# Patient Record
Sex: Female | Born: 2000 | Race: Black or African American | Hispanic: No | Marital: Single | Smoking: Never smoker
Health system: Southern US, Community
[De-identification: ages and names within clinical notes are randomized; demographics above are authoritative.]

---
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
## 2019-02-25 ENCOUNTER — Emergency Department (HOSPITAL_COMMUNITY): Payer: Medicaid Other

## 2019-02-25 ENCOUNTER — Emergency Department (HOSPITAL_COMMUNITY)
Admission: EM | Admit: 2019-02-25 | Discharge: 2019-02-25 | Disposition: A | Discharge status: Home / Self Care | Payer: Medicaid Other | Attending: Emergency Medicine | Admitting: Emergency Medicine

## 2019-02-25 DIAGNOSIS — R0789 Other chest pain: Secondary | ICD-10-CM | POA: Insufficient documentation

## 2019-02-25 DIAGNOSIS — R079 Chest pain, unspecified: Secondary | ICD-10-CM | POA: Diagnosis present

## 2019-02-25 DIAGNOSIS — R071 Chest pain on breathing: Secondary | ICD-10-CM | POA: Insufficient documentation

## 2019-02-25 LAB — I-STAT BETA HCG BLOOD, ED (MC, WL, AP ONLY): I-stat hCG, quantitative: <5 m[IU]/mL (ref <5)

## 2019-02-25 MED ORDER — KETOROLAC TROMETHAMINE 30 MG/ML IJ SOLN
30.0000 mg | Freq: Once | INTRAMUSCULAR | Status: AC
  Administered 2019-02-25: 14:00:00 30 mg via INTRAMUSCULAR
  Filled 2019-02-25: qty 1

## 2019-02-25 MED ORDER — NAPROXEN 500 MG PO TABS: 500.0000 mg | ORAL_TABLET | Freq: Two times a day (BID) | ORAL | 0 refills | Status: AC

## 2019-02-25 NOTE — Discharge Instructions (Signed)
Return to the ED if you start to experience worsening symptoms, increased chest pain or shortness of breath, leg swelling, vomiting or coughing up blood.

## 2019-02-25 NOTE — ED Triage Notes (Signed)
Patient to ER for 3 week history of central chest discomfort, reproducible on palpation and worsened with movement and deep breathing. Denies recent illness. Reports history of same with relief with ibuprofen. She is in NAD. No medical history.

## 2019-02-25 NOTE — ED Provider Notes (Signed)
[facility] EMERGENCY DEPARTMENT Provider Note   CSN: [id number] Arrival date & time: 02/25/19  1013     History   Chief Complaint Chief Complaint  Patient presents with  . Chest Pain    HPI Kelly Smith is a 18 y.o. female with a past medical history of obesity presenting to the ED with a chief complaint of chest pain.  For the past 3 to 4 weeks been having central chest pain that has been worse with palpation and movement.  She does endorse a dry persistent cough which worsens the chest pain.  She has had some improvement with NSAIDs.  Had an episode today which prompted her visit to the ED where the chest pain was "a lot more intense."  She denies any known injury or trigger for this chest pain.  Denies shortness of breath, hemoptysis, leg swelling, history of DVT or PE, OCP use, recent immobilization or surgery, history of MI, nausea, abdominal pain.     HPI  No past medical history on file.  Patient Active Problem List   Diagnosis Date Noted  . Abnormal bowel movement 09/19/2016  . Obesity with body mass index (BMI) in 95th to 98th percentile for age in pediatric patient 09/19/2016  . Failed vision screen 02/11/2015  . History of panic attacks 02/04/2015  . BMI (body mass index), pediatric, 5% to less than 85% for age 70/27/2016    No past surgical history on file.   OB History   No obstetric history on file.      Home Medications    Prior to Admission medications   Medication Sig Start Date End Date Taking? Authorizing Provider  ibuprofen (ADVIL,MOTRIN) 800 MG tablet Take 1 tablet (800 mg total) by mouth every 8 (eight) hours as needed. 08/09/17   Viviano Simas, NP  naproxen (NAPROSYN) 500 MG tablet Take 1 tablet (500 mg total) by mouth 2 (two) times daily. 02/25/19   Raymar Joiner, PA-C  trimethoprim-polymyxin b (POLYTRIM) ophthalmic solution Place 1 drop into both eyes every 4 (four) hours. Patient not taking: Reported on 02/04/2015  10/20/14   Niel Hummer, MD    Family History No family history on file.  Social History Social History   Tobacco Use  . Smoking status: Never Smoker  . Smokeless tobacco: Never Used  Substance Use Topics  . Alcohol use: Not on file  . Drug use: Not on file     Allergies   Other   Review of Systems Review of Systems  Constitutional: Negative for appetite change, chills and fever.  HENT: Negative for ear pain, rhinorrhea, sneezing and sore throat.   Eyes: Negative for photophobia and visual disturbance.  Respiratory: Negative for cough, chest tightness, shortness of breath and wheezing.   Cardiovascular: Positive for chest pain. Negative for palpitations.  Gastrointestinal: Negative for abdominal pain, blood in stool, constipation, diarrhea, nausea and vomiting.  Genitourinary: Negative for dysuria, hematuria and urgency.  Musculoskeletal: Negative for myalgias.  Skin: Negative for rash.  Neurological: Negative for dizziness, weakness and light-headedness.     Physical Exam Updated Vital Signs BP 115/64 (BP Location: Right Arm)   Pulse 64   Temp 98.6 F (37 C) (Oral)   Resp 16   LMP 01/27/2019 (Approximate)   SpO2 100%   Physical Exam Vitals signs and nursing note reviewed.  Constitutional:      General: She is not in acute distress.    Appearance: She is well-developed.     Comments: Speaking in  complete sentences without difficulty.  HENT:     Head: Normocephalic and atraumatic.     Nose: Nose normal.  Eyes:     General: No scleral icterus.       Left eye: No discharge.     Conjunctiva/sclera: Conjunctivae normal.  Neck:     Musculoskeletal: Normal range of motion and neck supple.  Cardiovascular:     Rate and Rhythm: Normal rate and regular rhythm.     Heart sounds: Normal heart sounds. No murmur. No friction rub. No gallop.   Pulmonary:     Effort: Pulmonary effort is normal. No respiratory distress.     Breath sounds: Normal breath sounds.   Chest:     Chest wall: Tenderness present.       Comments: Tenderness to palpation of the chest wall and indicated area. Abdominal:     General: Bowel sounds are normal. There is no distension.     Palpations: Abdomen is soft.     Tenderness: There is no abdominal tenderness. There is no guarding.  Musculoskeletal: Normal range of motion.  Skin:    General: Skin is warm and dry.     Findings: No rash.  Neurological:     Mental Status: She is alert.     Motor: No abnormal muscle tone.     Coordination: Coordination normal.      ED Treatments / Results  Labs (all labs ordered are listed, but only abnormal results are displayed) Labs Reviewed  I-STAT BETA HCG BLOOD, ED ([facility], [facility], [facility] ONLY)    EKG None  Radiology Dg Chest 2 View  Result Date: 02/25/2019 CLINICAL DATA:  Chest pain EXAM: CHEST - 2 VIEW COMPARISON:  None. FINDINGS: Lungs are clear. Heart size and pulmonary vascularity are normal. No adenopathy. No pneumothorax. No bone lesions. IMPRESSION: No edema or consolidation. Electronically Signed   By: Lowella Grip III M.D.   On: 02/25/2019 11:12    Procedures Procedures (including critical care time)  Medications Ordered in ED Medications  ketorolac (TORADOL) 30 MG/ML injection 30 mg (30 mg Intramuscular Given 02/25/19 1417)     Initial Impression / Assessment and Plan / ED Course  I have reviewed the triage vital signs and the nursing notes.  Pertinent labs & imaging results that were available during my care of the patient were reviewed by me and considered in my medical decision making (see chart for details).        18 year old female presenting to the ED with a chief complaint of chest pain.  Symptoms have been present for the past 3 to 4 weeks and worsened with palpation and movement.  She has noted some improvement with NSAIDs.  On exam patient is overall well-appearing.  She is speaking complete sentences without difficulty.  Vital signs are within  normal limits, she is not tachycardic, tachypneic or hypoxic.  Chest is tender to palpation on my exam. No lower extremity edema, erythema or calf tenderness that would concern me for DVT.  EKG shows normal sinus rhythm.  Chest x-ray is unremarkable.  HCG is negative.  Will treat with NSAIDs and have her follow-up with PCP.  She is PERC negative, low risk by heart score so I doubt serious cardiac or pulmonary cause of her symptoms.  Patient agreeable to the plan.  Patient is hemodynamically stable, in NAD, and able to ambulate in the ED. Evaluation does not show pathology that would require ongoing emergent intervention or inpatient treatment. I explained the diagnosis to  the patient. Pain has been managed and has no complaints prior to discharge. Patient is comfortable with above plan and is stable for discharge at this time. All questions were answered prior to disposition. Strict return precautions for returning to the ED were discussed. Encouraged follow up with PCP.   An After Visit Summary was printed and given to the patient.   Portions of this note were generated with Scientist, clinical (histocompatibility and immunogenetics)Dragon dictation software. Dictation errors may occur despite best attempts at proofreading.  Final Clinical Impressions(s) / ED Diagnoses   Final diagnoses:  Chest wall pain    ED Discharge Orders         Ordered    naproxen (NAPROSYN) 500 MG tablet  2 times daily     02/25/19 1422           Dietrich PatesKhatri, Jyair Kiraly, PA-C 02/25/19 1423    Terald Sleeperrifan, Matthew J, MD 02/26/19 [phone]

## 2019-03-20 IMAGING — DX DG PELVIS 1-2V
1 series · 1 of 1 positions shown · non-contrast
Comparison: None.

CLINICAL DATA: Pain over the sacrum and coccyx for 1-2 weeks, no
injury

EXAM:
PELVIS - 1-2 VIEW

[pelvis ap]
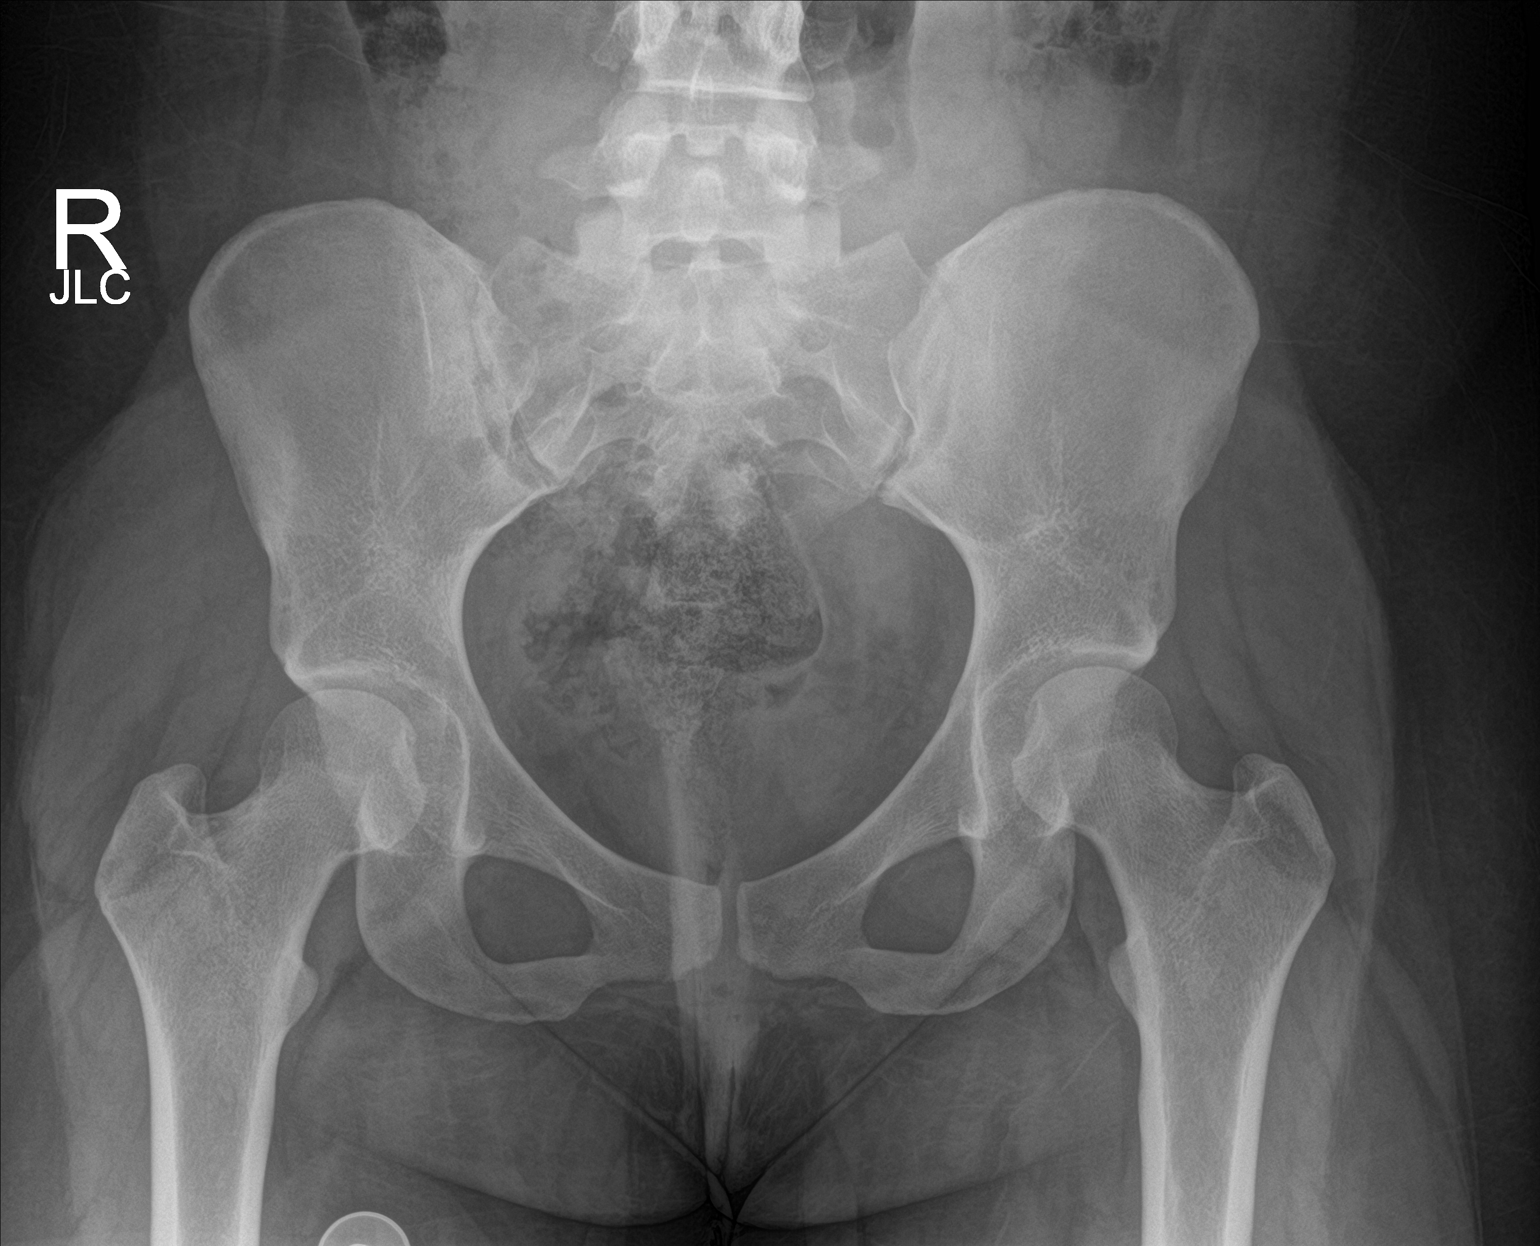

[1 of 1 positions shown; findings below may reference images not displayed]

FINDINGS: Both hips are normal position with normal joint spaces. The pelvic
rami are intact. The SI joints are corticated. The sacral foramina
appear normal. No bony abnormality is seen. No soft tissue
calcification is noted.
IMPRESSION: Negative.

## 2020-07-21 ENCOUNTER — Other Ambulatory Visit: Payer: Self-pay

## 2020-07-21 ENCOUNTER — Other Ambulatory Visit (HOSPITAL_COMMUNITY)
Admission: RE | Admit: 2020-07-21 | Discharge: 2020-07-21 | Disposition: A | Discharge status: Home / Self Care | Payer: Medicaid Other | Source: Ambulatory Visit

## 2020-07-21 ENCOUNTER — Ambulatory Visit (INDEPENDENT_AMBULATORY_CARE_PROVIDER_SITE_OTHER): Payer: Medicaid Other

## 2020-07-21 VITALS — BP 111/66 | HR 80 | Temp 97.6°F | Ht 66.0 in | Wt 191.8 lb

## 2020-07-21 DIAGNOSIS — Z113 Encounter for screening for infections with a predominantly sexual mode of transmission: Secondary | ICD-10-CM

## 2020-07-21 DIAGNOSIS — Z124 Encounter for screening for malignant neoplasm of cervix: Secondary | ICD-10-CM | POA: Diagnosis not present

## 2020-07-21 DIAGNOSIS — Z01419 Encounter for gynecological examination (general) (routine) without abnormal findings: Secondary | ICD-10-CM

## 2020-07-21 DIAGNOSIS — Z3009 Encounter for other general counseling and advice on contraception: Secondary | ICD-10-CM | POA: Diagnosis not present

## 2020-07-21 NOTE — Patient Instructions (Signed)

## 2020-07-21 NOTE — Progress Notes (Signed)
GYNECOLOGY OFFICE VISIT NOTE-WELL WOMAN EXAM  History:   Kelly Smith [id number] here today for annual exam.  Patient states she is leaving [place] and wants to make sure everything is "okay" before she leaves.  Patient also expresses desire for birth control method.  Reports monthly menses lasting 3-4 days, heavy to light, mild cramping, and no clots.  She denies any abnormal vaginal discharge, bleeding, pelvic pain.  She denies pain or discomfort during sexual activity.   Birth Control: None  Reproductive Concerns: Partners in last year: One, Female STD Testing: No history-Desires Full Breast Exams: No issues.  Exams every day.  Endorses SBA.   Patient endorses paternal grandmother and "some aunts" with history of breast cancer.  She denies family history of uterine, cervical, or ovarian cancer  Medical and Nutrition: No significant medical or surgical history PCP: Cherece Grier-No visits in past year  Exercise: Occasionally- 2-3x week; walking or basketball.  Tobacco/Drugs/Alcohol: Marijuana usage every other day, blunts. No etoh or tobacco usage.  Nutrition: " I have gotten better."   Social: Safety at home: Endorses DV/A: Denies Social Support: Endorses Employment: [employer]  No past medical history on file.  No past surgical history on file.  The following portions of the patient's history were reviewed and updated as appropriate: allergies, current medications, past family history, past medical history, past social history, past surgical history and problem list.   Health Maintenance:  Initial Pap collected today.  Normal mammogram d/t age.   Review of Systems:  Pertinent items noted in HPI and remainder of comprehensive ROS otherwise negative.    Objective:    Physical Exam BP 111/66 (BP Location: Left Arm, Patient Position: Sitting, Cuff Size: Normal)   Pulse 80   Temp 97.6 F (36.4 C) (Oral)   Ht 5\' 6"  (1.676 m)   Wt 191 lb 12.8 oz (87 kg)   LMP 07/07/2020  (Exact Date)   BMI 30.96 kg/m  Physical Exam Vitals reviewed. Exam conducted with a chaperone present.  Constitutional:      Appearance: Normal appearance.  HENT:     Head: Normocephalic and atraumatic.  Eyes:     Conjunctiva/sclera: Conjunctivae normal.  Neck:     Thyroid: No thyroid mass, thyromegaly or thyroid tenderness.  Cardiovascular:     Rate and Rhythm: Normal rate and regular rhythm.     Heart sounds: Normal heart sounds.  Pulmonary:     Effort: Pulmonary effort is normal. No respiratory distress.     Breath sounds: Normal breath sounds.  Abdominal:     General: Bowel sounds are normal.     Tenderness: There is no abdominal tenderness.  Genitourinary:    General: Normal vulva.     Labia:        Right: No tenderness or lesion.        Left: No tenderness or lesion.      Vagina: Vaginal discharge present. No tenderness or bleeding.     Cervix: No friability or erythema.     Uterus: Normal. Not enlarged and not tender.      Adnexa: Left adnexa normal.     Comments: NEFG Vaginal vault with moderate amt thick white discharge. Pap collected with brush and spatula. CV collected.  Musculoskeletal:        General: Normal range of motion.     Cervical back: Normal range of motion. No rigidity.  Skin:    General: Skin is warm and dry.  Neurological:     Mental  Status: She is alert and oriented to person, place, and time.  Psychiatric:        Mood and Affect: Mood normal.        Behavior: Behavior normal.        Thought Content: Thought content normal.      Labs and Imaging No results found for this or any previous visit (from the past 168 hour(s)). No results found.   Assessment & Plan:  20 year old Well woman exam Pap Smear Birth Control Counseling STD Testing   1. Well woman exam with routine gynecological exam -Introduced to tools utilized during speculum exam. -Exam performed and findings discussed. -Encouraged to activate Mychart. -Educated on AHA  exercise recommendations of 30 minutes of moderate to vigorous activity at least 5x/week. -Educated and encouraged to continue SBE with increased breast awareness including examination of breast for skin changes, moles, tenderness, etc.  -Informed that formal clinical breast exams to start at age 23 or earlier if issues are to arise.   2. Pap smear for cervical cancer screening -Educated on ASCCP guidelines regarding pap smear evaluation and frequency. -Informed of turnover time and provider/clinic policy on releasing results.  3. General counseling and advice on female contraception -Reviewed birth control methods by tiered approach. -Discussed risks and potential side effects. -Given information sheets regarding birth control types. -Encouraged to perform research and call or send mychart message to schedule appt. -Informed that she would not require appt for oral contraception initiation, but would require one month bp check.  4. Screen for STD (sexually transmitted disease) -Discussed safe sex practices. -Encouraged to know her status! Patient agrees. -Full STD testing performed today. -Informed that vaginal infection test added d/t exam with discharge noted.    Routine preventative health maintenance measures emphasized. Please refer to After Visit Summary for other counseling recommendations.   No follow-ups on file.     Kelly Smith, CNM 07/21/2020

## 2020-07-22 LAB — RPR+HBSAG+HCVAB+...
HIV Screen 4th Generation wRfx: NONREACTIVE
Hep C Virus Ab: <0.1 s/co ratio (ref 0.0–0.9)
Hepatitis B Surface Ag: NEGATIVE
RPR Ser Ql: NONREACTIVE

## 2020-07-23 LAB — CERVICOVAGINAL ANCILLARY ONLY
Bacterial Vaginitis (gardnerella): NEGATIVE
Candida Glabrata: NEGATIVE
Candida Vaginitis: NEGATIVE
Chlamydia: NEGATIVE
Comment: NEGATIVE
Comment: NEGATIVE
Comment: NEGATIVE
Comment: NEGATIVE
Comment: NEGATIVE
Comment: NORMAL
Neisseria Gonorrhea: NEGATIVE
Trichomonas: NEGATIVE

## 2020-07-23 LAB — CYTOLOGY - PAP: Diagnosis: NEGATIVE

## 2021-04-06 ENCOUNTER — Emergency Department (HOSPITAL_COMMUNITY)
Admission: EM | Admit: 2021-04-06 | Discharge: 2021-04-07 | Disposition: A | Discharge status: Home / Self Care | Payer: Medicaid Other | Attending: Emergency Medicine | Admitting: Emergency Medicine

## 2021-04-06 ENCOUNTER — Other Ambulatory Visit: Payer: Self-pay

## 2021-04-06 ENCOUNTER — Encounter (HOSPITAL_COMMUNITY): Payer: Self-pay | Admitting: Emergency Medicine

## 2021-04-06 ENCOUNTER — Emergency Department (HOSPITAL_COMMUNITY): Payer: Medicaid Other

## 2021-04-06 DIAGNOSIS — Z20822 Contact with and (suspected) exposure to covid-19: Secondary | ICD-10-CM | POA: Insufficient documentation

## 2021-04-06 DIAGNOSIS — R531 Weakness: Secondary | ICD-10-CM | POA: Insufficient documentation

## 2021-04-06 DIAGNOSIS — Y92410 Unspecified street and highway as the place of occurrence of the external cause: Secondary | ICD-10-CM | POA: Insufficient documentation

## 2021-04-06 DIAGNOSIS — T1490XA Injury, unspecified, initial encounter: Secondary | ICD-10-CM

## 2021-04-06 DIAGNOSIS — M546 Pain in thoracic spine: Secondary | ICD-10-CM | POA: Diagnosis not present

## 2021-04-06 DIAGNOSIS — R208 Other disturbances of skin sensation: Secondary | ICD-10-CM | POA: Diagnosis not present

## 2021-04-06 LAB — COMPREHENSIVE METABOLIC PANEL
ALT: 10 U/L (ref 0–44)
AST: 18 U/L (ref 15–41)
Albumin: 3.8 g/dL (ref 3.5–5.0)
Alkaline Phosphatase: 52 U/L (ref 38–126)
Anion gap: 7 (ref 5–15)
BUN: 10 mg/dL (ref 6–20)
CO2: 23 mmol/L (ref 22–32)
Calcium: 8.8 mg/dL — ABNORMAL LOW (ref 8.9–10.3)
Chloride: 106 mmol/L (ref 98–111)
Creatinine, Ser: 0.75 mg/dL (ref 0.44–1.00)
GFR, Estimated: >60 mL/min (ref >60)
Glucose, Bld: 86 mg/dL (ref 70–99)
Potassium: 3.7 mmol/L (ref 3.5–5.1)
Sodium: 136 mmol/L (ref 135–145)
Total Bilirubin: 0.8 mg/dL (ref 0.3–1.2)
Total Protein: 6.2 g/dL — ABNORMAL LOW (ref 6.5–8.1)

## 2021-04-06 LAB — I-STAT CHEM 8, ED
BUN: 12 mg/dL (ref 6–20)
Calcium, Ion: 1.1 mmol/L — ABNORMAL LOW (ref 1.15–1.40)
Chloride: 105 mmol/L (ref 98–111)
Creatinine, Ser: 0.7 mg/dL (ref 0.44–1.00)
Glucose, Bld: 83 mg/dL (ref 70–99)
HCT: 39 % (ref 36.0–46.0)
Hemoglobin: 13.3 g/dL (ref 12.0–15.0)
Potassium: 3.7 mmol/L (ref 3.5–5.1)
Sodium: 139 mmol/L (ref 135–145)
TCO2: 26 mmol/L (ref 22–32)

## 2021-04-06 LAB — CBC
HCT: 39 % (ref 36.0–46.0)
Hemoglobin: 12.3 g/dL (ref 12.0–15.0)
MCH: 26.9 pg (ref 26.0–34.0)
MCHC: 31.5 g/dL (ref 30.0–36.0)
MCV: 85.3 fL (ref 80.0–100.0)
Platelets: 185 10*3/uL (ref 150–400)
RBC: 4.57 MIL/uL (ref 3.87–5.11)
RDW: 13.9 % (ref 11.5–15.5)
WBC: 10.7 10*3/uL — ABNORMAL HIGH (ref 4.0–10.5)
nRBC: 0 % (ref 0.0–0.2)

## 2021-04-06 LAB — ETHANOL: Alcohol, Ethyl (B): <10 mg/dL (ref <10)

## 2021-04-06 LAB — SAMPLE TO BLOOD BANK

## 2021-04-06 LAB — PROTIME-INR
INR: 1.1 (ref 0.8–1.2)
Prothrombin Time: 14.4 seconds (ref 11.4–15.2)

## 2021-04-06 LAB — RESP PANEL BY RT-PCR (FLU A&B, COVID) ARPGX2
Influenza A by PCR: NEGATIVE
Influenza B by PCR: NEGATIVE
SARS Coronavirus 2 by RT PCR: NEGATIVE

## 2021-04-06 LAB — I-STAT BETA HCG BLOOD, ED (MC, WL, AP ONLY): I-stat hCG, quantitative: <5 m[IU]/mL (ref <5)

## 2021-04-06 LAB — LACTIC ACID, PLASMA: Lactic Acid, Venous: 1 mmol/L (ref 0.5–1.9)

## 2021-04-06 MED ORDER — IOHEXOL 300 MG/ML  SOLN
100.0000 mL | Freq: Once | INTRAMUSCULAR | Status: AC | PRN
  Administered 2021-04-06: 18:00:00 100 mL via INTRAVENOUS

## 2021-04-06 MED ORDER — SODIUM CHLORIDE 0.9 % IV BOLUS
500.0000 mL | Freq: Once | INTRAVENOUS | Status: AC
  Administered 2021-04-06: 18:00:00 500 mL via INTRAVENOUS

## 2021-04-06 MED ORDER — LORAZEPAM 2 MG/ML IJ SOLN
1.0000 mg | Freq: Once | INTRAMUSCULAR | Status: AC
  Administered 2021-04-06: 22:00:00 1 mg via INTRAVENOUS
  Filled 2021-04-06: qty 1

## 2021-04-06 MED ORDER — SODIUM CHLORIDE 0.9 % IV BOLUS
125.0000 mL | Freq: Once | INTRAVENOUS | Status: AC
  Administered 2021-04-06: 18:00:00 125 mL via INTRAVENOUS
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
## 2021-04-06 NOTE — Progress Notes (Signed)
°   04/06/21 1710  Clinical Encounter Type  Visited With Patient  Visit Type Trauma  Referral From Nurse  Consult/Referral To Chaplain    Chaplain Tery Sanfilippo responded to level 2 page. Chaplain offered the patient presence, emotional and spiritual support. There are no support person present. This note was prepared by Deneen Harts, M.Div..  For questions please contact by phone [phone].
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
## 2021-04-06 NOTE — ED Notes (Signed)
Patient transported to MRI 

## 2021-04-06 NOTE — ED Notes (Signed)
Pt reporting improvement in sensation/motor function of left arm and bilateral legs. Reports numbness/tingling. Able to move extremities on command at this time.
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
## 2021-04-06 NOTE — ED Notes (Signed)
Patient transported to CT 

## 2021-04-07 NOTE — ED Notes (Signed)
Pt ambulated independently to and from restroom. EDP at bedside to discuss disposition

## 2021-04-07 NOTE — Discharge Instructions (Signed)
Recommend rest is much as possible.  Take Motrin or Naprosyn as needed for any stiffness or pain.  Extensive work-ups here with no internal injuries no bony injuries and no evidence of any spinal cord injury in the neck chest or lower back area.  Discussed with on call neurosurgery Dr. Lovell Sheehan if MRI is negative okay for discharge home follow-up with primary care doctor.  Work note provided to be out of work for several days.  Follow-up with your primary care doctor as needed.

## 2021-04-26 ENCOUNTER — Telehealth: Payer: Self-pay

## 2021-04-26 NOTE — Telephone Encounter (Signed)
Called patient and left a message to inform her that her annual was not due until April 2023. She called back, and said she was fine and did not need appointment.

## 2021-04-27 ENCOUNTER — Encounter: Payer: Medicaid Other | Admitting: Advanced Practice Midwife

## 2021-04-28 ENCOUNTER — Encounter: Payer: Medicaid Other | Admitting: Obstetrics and Gynecology

## 2021-05-20 ENCOUNTER — Emergency Department (HOSPITAL_COMMUNITY): Payer: Medicaid Other

## 2021-05-20 ENCOUNTER — Emergency Department (HOSPITAL_COMMUNITY)
Admission: EM | Admit: 2021-05-20 | Discharge: 2021-05-20 | Disposition: A | Discharge status: Home / Self Care | Payer: Medicaid Other | Attending: Emergency Medicine | Admitting: Emergency Medicine

## 2021-05-20 DIAGNOSIS — D72829 Elevated white blood cell count, unspecified: Secondary | ICD-10-CM | POA: Diagnosis not present

## 2021-05-20 DIAGNOSIS — N390 Urinary tract infection, site not specified: Secondary | ICD-10-CM | POA: Diagnosis not present

## 2021-05-20 DIAGNOSIS — R1030 Lower abdominal pain, unspecified: Secondary | ICD-10-CM | POA: Diagnosis present

## 2021-05-20 DIAGNOSIS — R112 Nausea with vomiting, unspecified: Secondary | ICD-10-CM

## 2021-05-20 LAB — URINALYSIS, ROUTINE W REFLEX MICROSCOPIC
Bilirubin Urine: NEGATIVE
Glucose, UA: NEGATIVE mg/dL
Ketones, ur: NEGATIVE mg/dL
Nitrite: NEGATIVE
Protein, ur: 100 mg/dL — AB
RBC / HPF: >50 RBC/hpf — ABNORMAL HIGH (ref 0–5)
Specific Gravity, Urine: 1.024 (ref 1.005–1.030)
pH: 8 (ref 5.0–8.0)

## 2021-05-20 LAB — COMPREHENSIVE METABOLIC PANEL
ALT: 15 U/L (ref 0–44)
AST: 15 U/L (ref 15–41)
Albumin: 3.7 g/dL (ref 3.5–5.0)
Alkaline Phosphatase: 43 U/L (ref 38–126)
Anion gap: 8 (ref 5–15)
BUN: 12 mg/dL (ref 6–20)
CO2: 20 mmol/L — ABNORMAL LOW (ref 22–32)
Calcium: 8.9 mg/dL (ref 8.9–10.3)
Chloride: 109 mmol/L (ref 98–111)
Creatinine, Ser: 0.79 mg/dL (ref 0.44–1.00)
GFR, Estimated: >60 mL/min (ref >60)
Glucose, Bld: 113 mg/dL — ABNORMAL HIGH (ref 70–99)
Potassium: 4.1 mmol/L (ref 3.5–5.1)
Sodium: 137 mmol/L (ref 135–145)
Total Bilirubin: 0.4 mg/dL (ref 0.3–1.2)
Total Protein: 6.5 g/dL (ref 6.5–8.1)

## 2021-05-20 LAB — LIPASE, BLOOD: Lipase: 38 U/L (ref 11–51)

## 2021-05-20 LAB — I-STAT BETA HCG BLOOD, ED (MC, WL, AP ONLY): I-stat hCG, quantitative: <5 m[IU]/mL (ref <5)

## 2021-05-20 LAB — CBC
HCT: 40.9 % (ref 36.0–46.0)
Hemoglobin: 12.8 g/dL (ref 12.0–15.0)
MCH: 26 pg (ref 26.0–34.0)
MCHC: 31.3 g/dL (ref 30.0–36.0)
MCV: 83.1 fL (ref 80.0–100.0)
Platelets: 222 10*3/uL (ref 150–400)
RBC: 4.92 MIL/uL (ref 3.87–5.11)
RDW: 13.6 % (ref 11.5–15.5)
WBC: 13.5 10*3/uL — ABNORMAL HIGH (ref 4.0–10.5)
nRBC: 0 % (ref 0.0–0.2)

## 2021-05-20 MED ORDER — ONDANSETRON 4 MG PO TBDP: 4.0000 mg | ORAL_TABLET | Freq: Three times a day (TID) | ORAL | 0 refills | Status: AC | PRN

## 2021-05-20 MED ORDER — CEPHALEXIN 500 MG PO CAPS: 500.0000 mg | ORAL_CAPSULE | Freq: Two times a day (BID) | ORAL | 0 refills | Status: AC

## 2021-05-20 MED ORDER — ONDANSETRON HCL 4 MG/2ML IJ SOLN
4.0000 mg | Freq: Once | INTRAMUSCULAR | Status: AC
  Administered 2021-05-20: 4 mg via INTRAVENOUS
  Filled 2021-05-20: qty 2

## 2021-05-20 MED ORDER — SODIUM CHLORIDE 0.9 % IV BOLUS
1000.0000 mL | Freq: Once | INTRAVENOUS | Status: AC
  Administered 2021-05-20: 1000 mL via INTRAVENOUS

## 2021-05-20 MED ORDER — SODIUM CHLORIDE 0.9 % IV SOLN
1.0000 g | Freq: Once | INTRAVENOUS | Status: AC
  Administered 2021-05-20: 1 g via INTRAVENOUS
  Filled 2021-05-20: qty 10

## 2021-05-20 MED ORDER — IOHEXOL 300 MG/ML  SOLN
80.0000 mL | Freq: Once | INTRAMUSCULAR | Status: AC | PRN
  Administered 2021-05-20: 80 mL via INTRAVENOUS

## 2021-05-20 MED ORDER — KETOROLAC TROMETHAMINE 30 MG/ML IJ SOLN
30.0000 mg | Freq: Once | INTRAMUSCULAR | Status: AC
  Administered 2021-05-20: 30 mg via INTRAVENOUS
  Filled 2021-05-20: qty 1

## 2021-05-20 NOTE — Discharge Instructions (Signed)
Please pick up medications and take as prescribed to cover for a urinary tract infection  Follow up with your PCP next week for further eval   We have sent your urine for culture and will call you in 2-3 days if the antibiotics need to be changed  Return to the ED for any new/worsening symptoms

## 2021-05-20 NOTE — ED Provider Notes (Signed)
[facility] EMERGENCY DEPARTMENT Provider Note   CSN: [id number] Arrival date & time: 05/20/21  0805     History  Chief Complaint  Patient presents with   Abdominal Pain   Emesis   Nausea    Kelly Smith is a 21 y.o. female who presents to the ED today with complaint of gradual onset, constant, worsening, sharp, lower abdominal pain that began this morning. Pt also complains of nausea and bilious emesis. She mentions starting her menstrual cycle this morning however her pain feels different than typical menstrual cramps and she does not typically have nausea and vomiting with same. Pt denies fevers or chills. She last had a BM this morning and reports it was normal for her. She denies any dysuria, urinary frequency, or urgency. No previous abdominal surgeries.   The history is provided by the patient and medical records.      Home Medications Prior to Admission medications   Medication Sig Start Date End Date Taking? Authorizing Provider  cephALEXin (KEFLEX) 500 MG capsule Take 1 capsule (500 mg total) by mouth 2 (two) times daily for 5 days. 05/20/21 05/25/21 Yes Julanne Schlueter, PA-C  ondansetron (ZOFRAN-ODT) 4 MG disintegrating tablet Take 1 tablet (4 mg total) by mouth every 8 (eight) hours as needed for nausea or vomiting. 05/20/21  Yes Alroy Bailiff, Alyxander Kollmann, PA-C  ibuprofen (ADVIL,MOTRIN) 800 MG tablet Take 1 tablet (800 mg total) by mouth every 8 (eight) hours as needed. Patient not taking: Reported on 07/21/2020 08/09/17   Charmayne Sheer, NP  naproxen (NAPROSYN) 500 MG tablet Take 1 tablet (500 mg total) by mouth 2 (two) times daily. Patient not taking: Reported on 07/21/2020 02/25/19   Delia Heady, PA-C  trimethoprim-polymyxin b (POLYTRIM) ophthalmic solution Place 1 drop into both eyes every 4 (four) hours. Patient not taking: No sig reported 10/20/14   Louanne Skye, MD      Allergies    Other    Review of Systems   Review of Systems  Constitutional:   Negative for chills and fever.  Gastrointestinal:  Positive for abdominal pain, nausea and vomiting. Negative for constipation and diarrhea.  Genitourinary:  Negative for dysuria, flank pain, frequency and hematuria.  All other systems reviewed and are negative.  Physical Exam Updated Vital Signs BP 111/63    Pulse 67    Temp 98 F (36.7 C) (Oral)    Resp (!) 22    LMP 05/20/2021    SpO2 100%  Physical Exam Vitals and nursing note reviewed.  Constitutional:      Appearance: She is not ill-appearing or diaphoretic.  HENT:     Head: Normocephalic and atraumatic.  Eyes:     Conjunctiva/sclera: Conjunctivae normal.  Cardiovascular:     Rate and Rhythm: Normal rate and regular rhythm.     Heart sounds: Normal heart sounds.  Pulmonary:     Effort: Pulmonary effort is normal.     Breath sounds: Normal breath sounds. No wheezing, rhonchi or rales.  Abdominal:     Palpations: Abdomen is soft.     Tenderness: There is abdominal tenderness in the periumbilical area and suprapubic area. There is no right CVA tenderness, left CVA tenderness, guarding or rebound.  Musculoskeletal:     Cervical back: Neck supple.  Skin:    General: Skin is warm and dry.  Neurological:     Mental Status: She is alert.    ED Results / Procedures / Treatments   Labs (all labs ordered are listed, but  only abnormal results are displayed) Labs Reviewed  COMPREHENSIVE METABOLIC PANEL - Abnormal; Notable for the following components:      Result Value   CO2 20 (*)    Glucose, Bld 113 (*)    All other components within normal limits  CBC - Abnormal; Notable for the following components:   WBC 13.5 (*)    All other components within normal limits  URINALYSIS, ROUTINE W REFLEX MICROSCOPIC - Abnormal; Notable for the following components:   APPearance CLOUDY (*)    Hgb urine dipstick MODERATE (*)    Protein, ur 100 (*)    Leukocytes,Ua SMALL (*)    RBC / HPF >50 (*)    Bacteria, UA RARE (*)    All other  components within normal limits  LIPASE, BLOOD  I-STAT BETA HCG BLOOD, ED ([facility], [facility], [facility] ONLY)    EKG None  Radiology CT Abdomen Pelvis W Contrast  Result Date: 05/20/2021 CLINICAL DATA:  Abdominal pain, acute, nonlocalized EXAM: CT ABDOMEN AND PELVIS WITH CONTRAST TECHNIQUE: Multidetector CT imaging of the abdomen and pelvis was performed using the standard protocol following bolus administration of intravenous contrast. RADIATION DOSE REDUCTION: This exam was performed according to the departmental dose-optimization program which includes automated exposure control, adjustment of the mA and/or kV according to patient size and/or use of iterative reconstruction technique. CONTRAST:  14mL OMNIPAQUE IOHEXOL 300 MG/ML  SOLN COMPARISON:  04/06/2021 FINDINGS: Lower chest: Patchy mucous plugging posteromedial left lower lobe with associated patchy atelectasis. Hepatobiliary: No focal liver abnormality is seen. No gallstones, gallbladder wall thickening, or biliary dilatation. Pancreas: Unremarkable. No pancreatic ductal dilatation or surrounding inflammatory changes. Spleen: Normal in size without focal abnormality. Adrenals/Urinary Tract: Adrenals and kidneys are unremarkable. Poorly distended bladder is unremarkable. Stomach/Bowel: Stomach is within normal limits. Bowel is normal in caliber. Normal appendix. Vascular/Lymphatic: No significant vascular abnormality. No enlarged nodes. Reproductive: Uterus and bilateral adnexa are unremarkable. Other: Trace free fluid in the dependent pelvis is probably physiologic. Musculoskeletal: No acute osseous abnormality. IMPRESSION: No acute abnormality in the abdomen or pelvis. Mild mucous plugging posteromedial left lower lobe with patchy atelectasis. Electronically Signed   By: Macy Mis M.D.   On: 05/20/2021 14:03    Procedures Procedures    Medications Ordered in ED Medications  sodium chloride 0.9 % bolus 1,000 mL (0 mLs Intravenous Stopped 05/20/21 1401)   ondansetron (ZOFRAN) injection 4 mg (4 mg Intravenous Given 05/20/21 1314)  ketorolac (TORADOL) 30 MG/ML injection 30 mg (30 mg Intravenous Given 05/20/21 1322)  cefTRIAXone (ROCEPHIN) 1 g in sodium chloride 0.9 % 100 mL IVPB (0 g Intravenous Stopped 05/20/21 1401)  iohexol (OMNIPAQUE) 300 MG/ML solution 80 mL (80 mLs Intravenous Contrast Given 05/20/21 1354)    ED Course/ Medical Decision Making/ A&P                           Medical Decision Making 21 year old female who presents to the ED today with complaint of lower abdominal pain, nausea, vomiting that began this morning.  On arrival to the ED vitals are stable.  Patient labs collected and was placed back in the waiting room.  CBC with mild elevation in white blood cell count of 13,500 without left shift.  CMP with glucose 113 and bicarb 20; no gap. No other electrolyte abnormalities. Lipase WNL at 38. Beta hcg negative. U/A with small leuks, > 50 RBCs, 11-20 WBCs, rare bacteria.  On my exam patient is noted to  have periumbilical and suprapubic abdominal tenderness palpation.  She is currently on her menstrual cycle however states that her pain is atypical to same and does not typically have nausea and vomiting.  Denies any urinary symptoms.  Urinalysis is somewhat convincing for UTI, will plan for Rocephin at this time however we will also plan for CT scan for further evaluation given periumbilical pain as well as elevated white blood cell count to assess for possible appendicitis. Pt denies any pelvic pain or vaginal discharge; lower suspicion for torsion, TOA, PID.   CT scan negative at this time for acute findings. On reevaluation pt resting comfortably. She has been able to fluid challenge successfully. Will discharge home with treatment for UTI. Pt instructed to follow up with her PCP for further evaluation. Urine culture pending at this time. Pt in agreement with plan and stable for discharge home.   Problems Addressed: Lower urinary tract  infectious disease: acute illness or injury Nausea and vomiting, unspecified vomiting type: acute illness or injury  Amount and/or Complexity of Data Reviewed Labs: ordered. Decision-making details documented in ED Course. Radiology: ordered.    Details: CT scan without acute findings in the abdomen/pelvis. Does show patchy atelectasis and mucous plugging at LLL. No cough or complaints of SOB.  Risk Prescription drug management.          Final Clinical Impression(s) / ED Diagnoses Final diagnoses:  Lower urinary tract infectious disease  Nausea and vomiting, unspecified vomiting type    Rx / DC Orders ED Discharge Orders          Ordered    ondansetron (ZOFRAN-ODT) 4 MG disintegrating tablet  Every 8 hours PRN        05/20/21 1458    cephALEXin (KEFLEX) 500 MG capsule  2 times daily        05/20/21 1458             Discharge Instructions      Please pick up medications and take as prescribed to cover for a urinary tract infection  Follow up with your PCP next week for further eval   We have sent your urine for culture and will call you in 2-3 days if the antibiotics need to be changed  Return to the ED for any new/worsening symptoms        Eustaquio Maize, PA-C 05/20/21 [place], Julie, MD 05/21/21 [phone]

## 2021-05-20 NOTE — ED Triage Notes (Signed)
EMS stated, lower abdominal pain N/V since this morning.

## 2022-12-29 IMAGING — CT CT ABD-PELV W/ CM
2 of 4 series · 17 of 46 positions shown, 19 images · IV contrast (APPLIED)
Comparison: 04/06/2021

CLINICAL DATA: Abdominal pain, acute, nonlocalized

EXAM:
CT ABDOMEN AND PELVIS WITH CONTRAST
TECHNIQUE: Multidetector CT imaging of the abdomen and pelvis was performed
using the standard protocol following bolus administration of
intravenous contrast.

[Series 3: abdomen 5.0 · axial · 0.80mm/px · z∈[+690,+1130]mm · 14 of 100 slices shown, 16 images]
[im 6/100  soft-tissue]
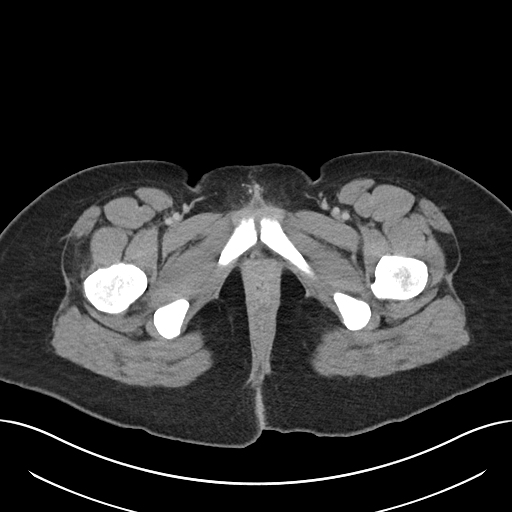
[im 6/100  bone]
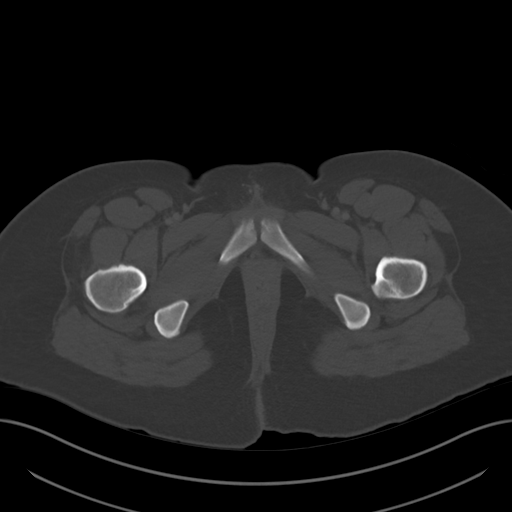
[im 12/100  soft-tissue]
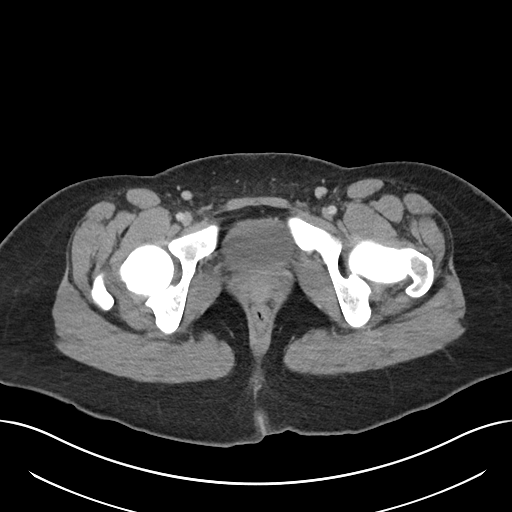
[im 23/100  soft-tissue]
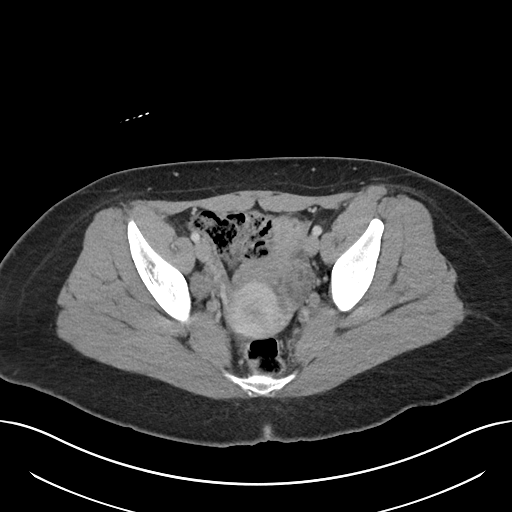
[im 28/100  soft-tissue]
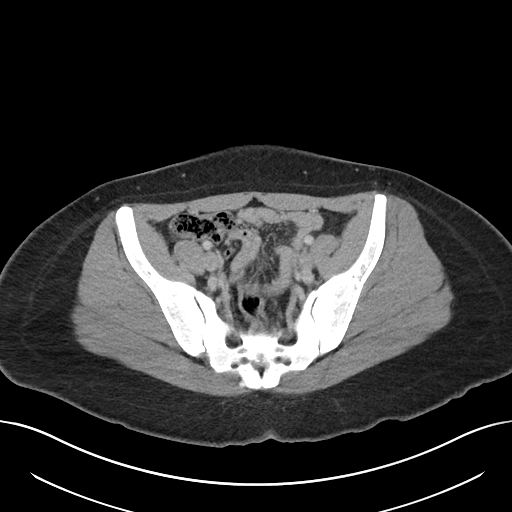
[im 34/100  soft-tissue]
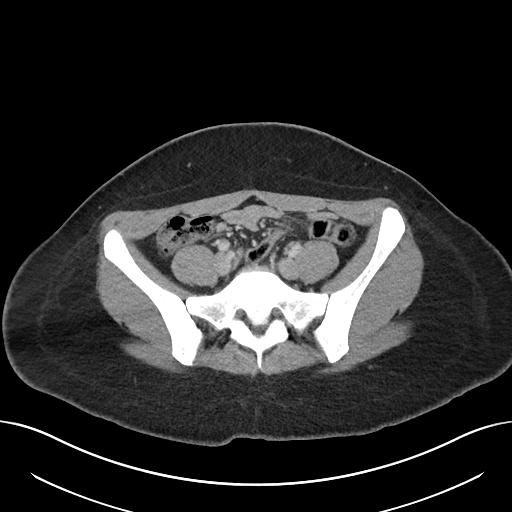
[im 39/100  soft-tissue]
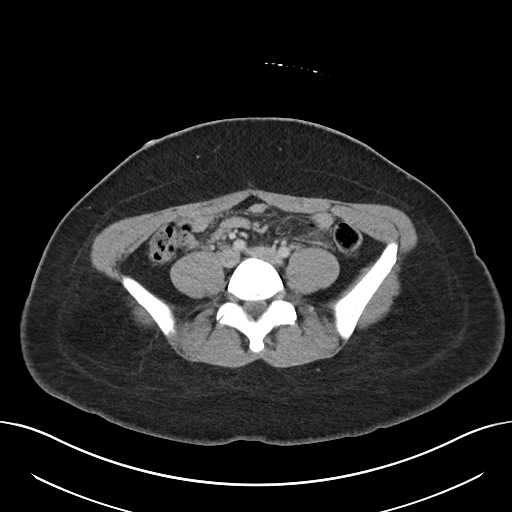
[im 45/100  soft-tissue]
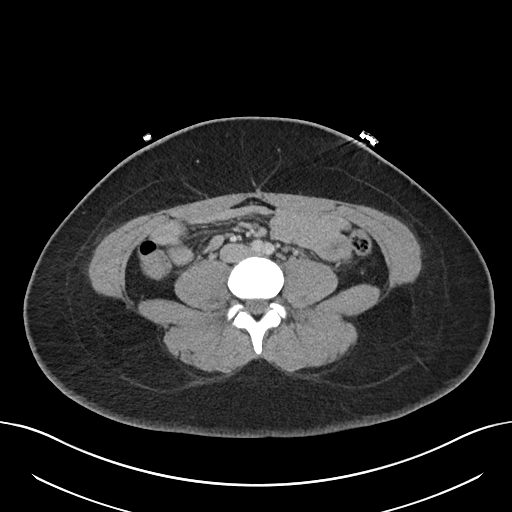
[im 56/100  soft-tissue]
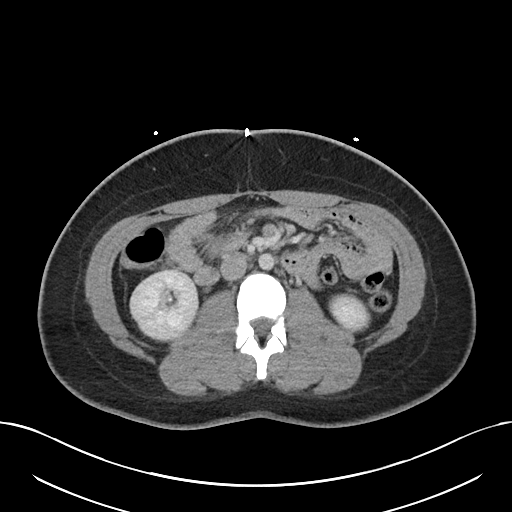
[im 61/100  soft-tissue]
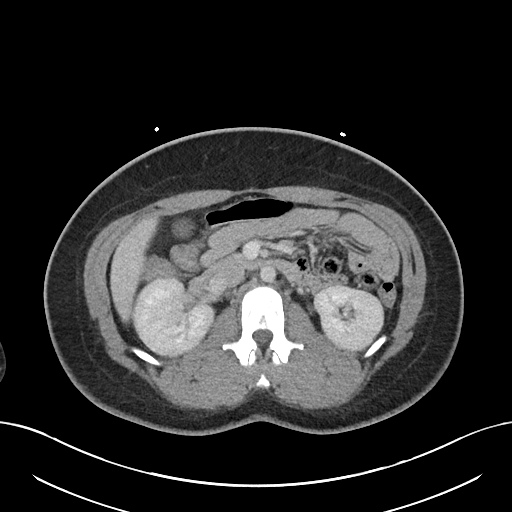
[im 61/100  bone]
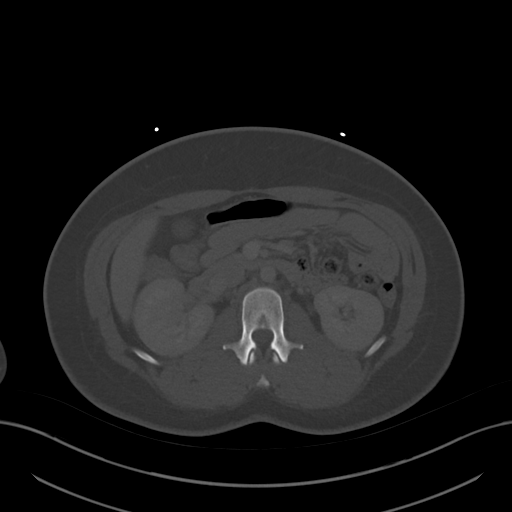
[im 67/100  soft-tissue]
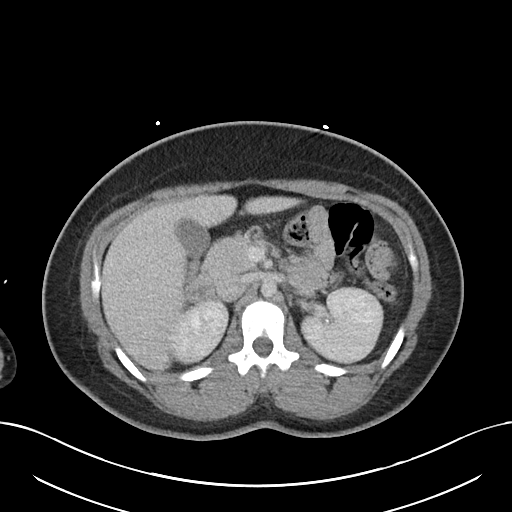
[im 72/100  soft-tissue]
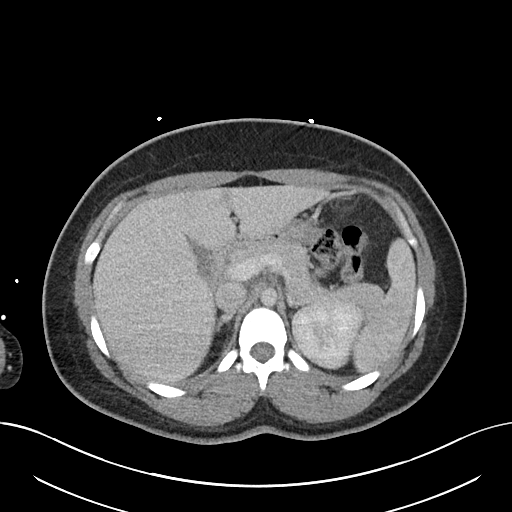
[im 78/100  soft-tissue]
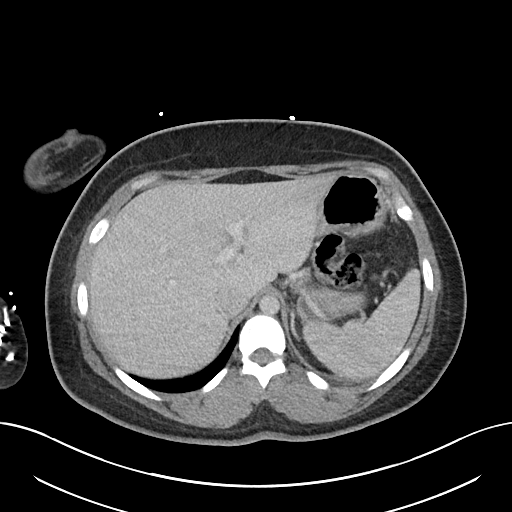
[im 89/100  soft-tissue]
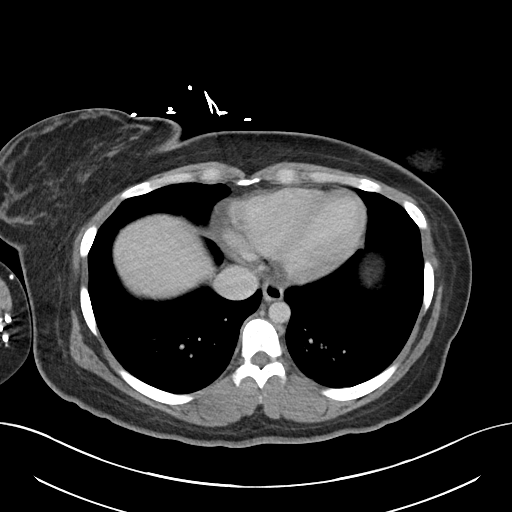
[im 94/100  soft-tissue]
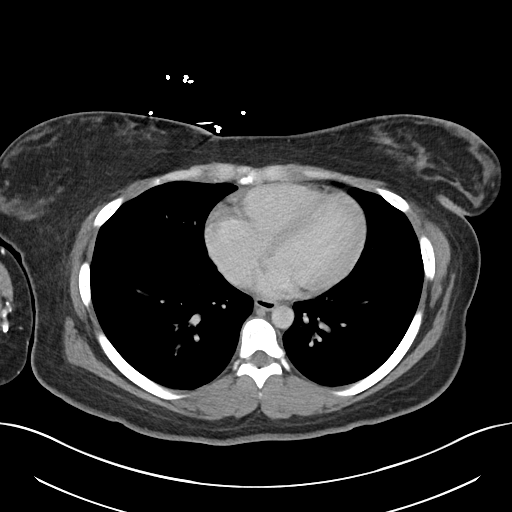

[Series 6: abdomen 3.0 mpr cor · coronal · 0.94mm/px · 3 of 94 slices shown]
[im 32/94  soft-tissue]
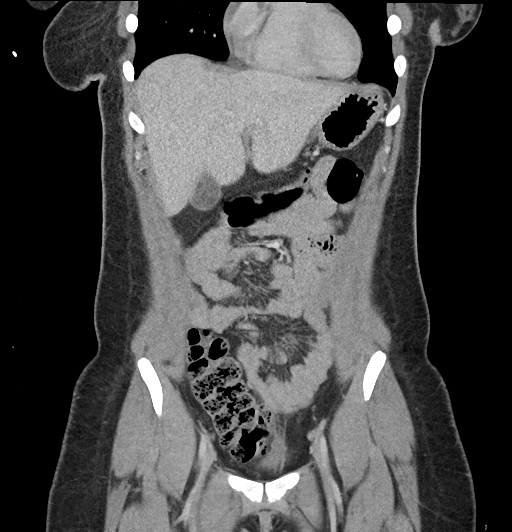
[im 42/94  soft-tissue]
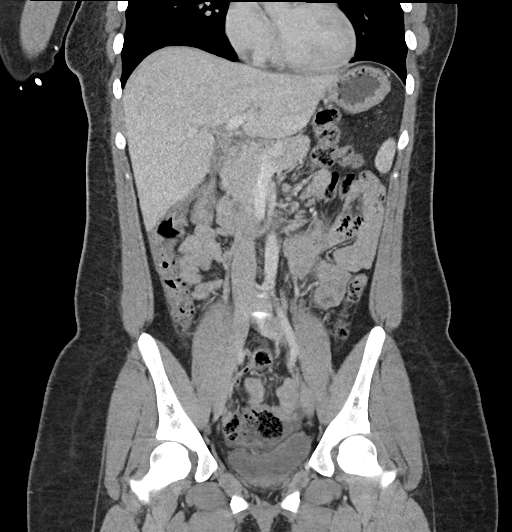
[im 52/94  soft-tissue]
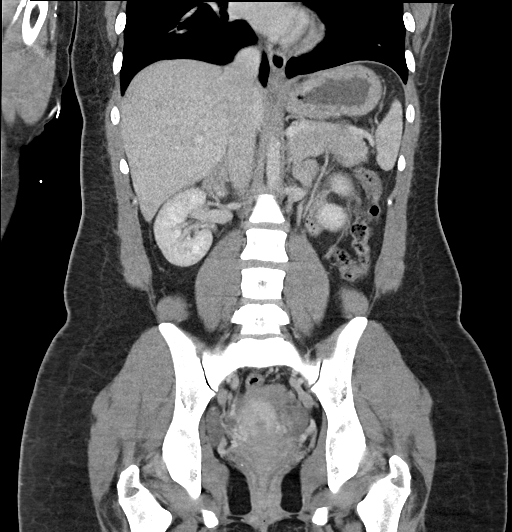

[17 of 46 positions shown; findings below may reference images not displayed]

RADIATION DOSE REDUCTION: This exam was performed according to the
departmental dose-optimization program which includes automated
exposure control, adjustment of the mA and/or kV according to
patient size and/or use of iterative reconstruction technique.

CONTRAST:  80mL OMNIPAQUE IOHEXOL 300 MG/ML  SOLN
FINDINGS: Lower chest: Patchy mucous plugging posteromedial left lower lobe
with associated patchy atelectasis.

Hepatobiliary: No focal liver abnormality is seen. No gallstones,
gallbladder wall thickening, or biliary dilatation.

Pancreas: Unremarkable. No pancreatic ductal dilatation or
surrounding inflammatory changes.

Spleen: Normal in size without focal abnormality.

Adrenals/Urinary Tract: Adrenals and kidneys are unremarkable.
Poorly distended bladder is unremarkable.

Stomach/Bowel: Stomach is within normal limits. Bowel is normal in
caliber. Normal appendix.

Vascular/Lymphatic: No significant vascular abnormality. No enlarged
nodes.

Reproductive: Uterus and bilateral adnexa are unremarkable.

Other: Trace free fluid in the dependent pelvis is probably
physiologic.

Musculoskeletal: No acute osseous abnormality.
IMPRESSION: No acute abnormality in the abdomen or pelvis.

Mild mucous plugging posteromedial left lower lobe with patchy
atelectasis.
